# Patient Record
Sex: Male | Born: 1995 | Race: Black or African American | Hispanic: No | Marital: Single | Smoking: Current every day smoker
Health system: Southern US, Community
[De-identification: ages and names within clinical notes are randomized; demographics above are authoritative.]

---

## 2016-10-18 ENCOUNTER — Ambulatory Visit (HOSPITAL_COMMUNITY)
Admission: EM | Admit: 2016-10-18 | Discharge: 2016-10-18 | Disposition: A | Discharge status: Home / Self Care | Payer: Self-pay | Attending: Emergency Medicine | Admitting: Emergency Medicine

## 2016-10-18 ENCOUNTER — Encounter (HOSPITAL_COMMUNITY)
Admission: EM | Disposition: A | Discharge status: Home / Self Care | Payer: Self-pay | Source: Home / Self Care | Attending: Emergency Medicine

## 2016-10-18 ENCOUNTER — Encounter (HOSPITAL_COMMUNITY): Payer: Self-pay

## 2016-10-18 ENCOUNTER — Emergency Department (HOSPITAL_COMMUNITY): Payer: Self-pay

## 2016-10-18 DIAGNOSIS — R490 Dysphonia: Secondary | ICD-10-CM | POA: Insufficient documentation

## 2016-10-18 DIAGNOSIS — R112 Nausea with vomiting, unspecified: Secondary | ICD-10-CM | POA: Insufficient documentation

## 2016-10-18 DIAGNOSIS — F17210 Nicotine dependence, cigarettes, uncomplicated: Secondary | ICD-10-CM | POA: Insufficient documentation

## 2016-10-18 DIAGNOSIS — R079 Chest pain, unspecified: Secondary | ICD-10-CM | POA: Insufficient documentation

## 2016-10-18 DIAGNOSIS — T5491XA Toxic effect of unspecified corrosive substance, accidental (unintentional), initial encounter: Secondary | ICD-10-CM | POA: Insufficient documentation

## 2016-10-18 DIAGNOSIS — K219 Gastro-esophageal reflux disease without esophagitis: Secondary | ICD-10-CM | POA: Insufficient documentation

## 2016-10-18 HISTORY — PX: ESOPHAGOGASTRODUODENOSCOPY: SHX5428

## 2016-10-18 LAB — CBC WITH DIFFERENTIAL/PLATELET
BASOS PCT: 1 %
Basophils Absolute: 0 10*3/uL (ref 0.0–0.1)
Eosinophils Absolute: 0.2 10*3/uL (ref 0.0–0.7)
Eosinophils Relative: 2 %
HEMATOCRIT: 43.6 % (ref 39.0–52.0)
HEMOGLOBIN: 14.7 g/dL (ref 13.0–17.0)
LYMPHS PCT: 15 %
Lymphs Abs: 1.3 10*3/uL (ref 0.7–4.0)
MCH: 27.3 pg (ref 26.0–34.0)
MCHC: 33.7 g/dL (ref 30.0–36.0)
MCV: 81 fL (ref 78.0–100.0)
MONOS PCT: 7 %
Monocytes Absolute: 0.6 10*3/uL (ref 0.1–1.0)
NEUTROS ABS: 6.5 10*3/uL (ref 1.7–7.7)
NEUTROS PCT: 75 %
Platelets: 274 10*3/uL (ref 150–400)
RBC: 5.38 MIL/uL (ref 4.22–5.81)
RDW: 12.5 % (ref 11.5–15.5)
WBC: 8.5 10*3/uL (ref 4.0–10.5)

## 2016-10-18 LAB — BASIC METABOLIC PANEL
ANION GAP: 10 (ref 5–15)
BUN: 12 mg/dL (ref 6–20)
CALCIUM: 9.8 mg/dL (ref 8.9–10.3)
CHLORIDE: 107 mmol/L (ref 101–111)
CO2: 22 mmol/L (ref 22–32)
Creatinine, Ser: 1 mg/dL (ref 0.61–1.24)
GFR calc non Af Amer: >60 mL/min (ref >60)
Glucose, Bld: 105 mg/dL — ABNORMAL HIGH (ref 65–99)
POTASSIUM: 3.8 mmol/L (ref 3.5–5.1)
Sodium: 139 mmol/L (ref 135–145)

## 2016-10-18 SURGERY — EGD (ESOPHAGOGASTRODUODENOSCOPY)
Anesthesia: Moderate Sedation

## 2016-10-18 MED ORDER — SODIUM CHLORIDE 0.9 % IV SOLN: INTRAVENOUS | Status: DC

## 2016-10-18 MED ORDER — ONDANSETRON 4 MG PO TBDP: 4.0000 mg | ORAL_TABLET | Freq: Three times a day (TID) | ORAL | 0 refills | Status: AC | PRN

## 2016-10-18 MED ORDER — DIPHENHYDRAMINE HCL 50 MG/ML IJ SOLN
INTRAMUSCULAR | Status: DC | PRN
  Administered 2016-10-18: 25 mg via INTRAVENOUS

## 2016-10-18 MED ORDER — SODIUM CHLORIDE 0.9 % IV BOLUS (SEPSIS)
1000.0000 mL | Freq: Once | INTRAVENOUS | Status: AC
  Administered 2016-10-18: 1000 mL via INTRAVENOUS

## 2016-10-18 MED ORDER — FENTANYL CITRATE (PF) 100 MCG/2ML IJ SOLN
INTRAMUSCULAR | Status: DC | PRN
  Administered 2016-10-18 (×2): 25 ug via INTRAVENOUS

## 2016-10-18 MED ORDER — MIDAZOLAM HCL 5 MG/ML IJ SOLN
INTRAMUSCULAR | Status: AC
  Filled 2016-10-18: qty 2

## 2016-10-18 MED ORDER — ONDANSETRON HCL 4 MG/2ML IJ SOLN
4.0000 mg | Freq: Once | INTRAMUSCULAR | Status: AC
  Administered 2016-10-18: 4 mg via INTRAVENOUS
  Filled 2016-10-18: qty 2

## 2016-10-18 MED ORDER — FENTANYL CITRATE (PF) 100 MCG/2ML IJ SOLN
INTRAMUSCULAR | Status: AC
  Filled 2016-10-18: qty 2

## 2016-10-18 MED ORDER — DIPHENHYDRAMINE HCL 50 MG/ML IJ SOLN
INTRAMUSCULAR | Status: AC
  Filled 2016-10-18: qty 1

## 2016-10-18 MED ORDER — MIDAZOLAM HCL 10 MG/2ML IJ SOLN
INTRAMUSCULAR | Status: DC | PRN
  Administered 2016-10-18 (×2): 2 mg via INTRAVENOUS

## 2016-10-18 NOTE — ED Triage Notes (Signed)
Pt reports he drank carpet cleaner that was in a mountain dew bottle. Pt initially thought it was detergent and called poison control. Pt now states he is vomiting and vomiting blood. Pt does not know the brand or type of carpet cleaner.

## 2016-10-18 NOTE — ED Notes (Signed)
Pt states that he is sick and cannot stop throwing up. Pt in waiting room drinking water and eating rice crispy treats with no problem.

## 2016-10-18 NOTE — Sedation Documentation (Signed)
Pt able to ambulate without difficulty to bathroom.

## 2016-10-18 NOTE — ED Notes (Signed)
GI team at bedside.  Consent signed.

## 2016-10-18 NOTE — Discharge Instructions (Signed)
Stay on a liquid diet today. Tomorrow, if you feel able, try soft foods and advance diet as you feel able.  Zofran as needed for nausea. The coupon I have provided is only valid at [facility].  Return to ER for new or worsening symptoms, any additional concerns.

## 2016-10-18 NOTE — ED Notes (Signed)
Pt continues to spit into emesis bag.  States he is nauseated.

## 2016-10-18 NOTE — ED Notes (Signed)
Pt pacing around waiting room stating that he feels like he is going to pass out. Called to see if room was ready. Staff in back stated they were coming to get the pt at this time.

## 2016-10-18 NOTE — Op Note (Signed)
[facility] Patient Name: Gabriel Frank Procedure Date : 10/18/2016 MRN: [id number] Attending MD: Kathi Der , MD Date of Birth: Apr 04, 1996 CSN: [id number] Age: 20 Admit Type: Emergency Department Procedure:                Upper GI endoscopy Indications:              Suspected esophageal injury due to caustic ingestion Providers:                Kathi Der, MD, Omelia Blackwater, RN, Clearnce Sorrel, Technician Referring MD:              Medicines:                Midazolam 4 mg IV, Fentanyl 50 micrograms IV,                            Diphenhydramine 25 mg IV Complications:            No immediate complications. Estimated Blood Loss:     Estimated blood loss: none. Procedure:                Pre-Anesthesia Assessment:                           - Prior to the procedure, a History and Physical                            was performed, and patient medications and                            allergies were reviewed. The patient's tolerance of                            previous anesthesia was also reviewed. The risks                            and benefits of the procedure and the sedation                            options and risks were discussed with the patient.                            All questions were answered, and informed consent                            was obtained. Prior Anticoagulants: The patient has                            taken no previous anticoagulant or antiplatelet                            agents. ASA Grade Assessment: I - A normal, healthy  patient. After reviewing the risks and benefits,                            the patient was deemed in satisfactory condition to                            undergo the procedure.                           After obtaining informed consent, the endoscope was                            passed under direct vision. Throughout the   procedure, the patient's blood pressure, pulse, and                            oxygen saturations were monitored continuously. The                            EG-[id number] ([id number]) scope was introduced through the                            mouth, with the intention of advancing to the                            stomach. The scope was advanced to the prepyloric                            antrum before the procedure was aborted.                            Medications were given. The procedure was aborted                            due to the patient's combativeness. Scope In: Scope Out: Findings:      The examined esophagus was normal.      Retained fluid was found in the gastric fundus and in the gastric body.      The exam was otherwise without abnormality. Procedure was aborted as       patient was combative and he remvoed scope out by his hands . Impression:               - The procedure was aborted due to the patient's                            combativeness.                           - The procedure was aborted.                           - Normal esophagus.                           - Retained gastric fluid.                           -  The examination was otherwise normal.                           - No specimens collected. Moderate Sedation:      Moderate (conscious) sedation was administered by the endoscopy nurse       and supervised by the endoscopist. The following parameters were       monitored: oxygen saturation, heart rate, blood pressure, and response       to care. Recommendation:           - Patient has a contact number available for                            emergencies. The signs and symptoms of potential                            delayed complications were discussed with the                            patient. Return to normal activities tomorrow.                            Written discharge instructions were provided to the                            patient.                            - Full liquid diet.                           - Continue present medications. Procedure Code(s):        --- Professional ---                           [phone], 52, Esophagogastroduodenoscopy, flexible,                            transoral; diagnostic, including collection of                            specimen(s) by brushing or washing, when performed                            (separate procedure) Diagnosis Code(s):        --- Professional ---                           Z53.8, Procedure and treatment not carried out for                            other reasons                           T54.94XA, Toxic effect of unspecified corrosive  substance, undetermined, initial encounter CPT copyright 2016 [employer] Association. All rights reserved. The codes documented in this report are preliminary and upon coder review may  be revised to meet current compliance requirements. [name], MD Kathi DerParag Gabriel Eddleman, MD 10/18/2016 1:49:36 PM Number of Addenda: 0

## 2016-10-18 NOTE — Brief Op Note (Signed)
10/18/2016  1:50 PM  PATIENT:  Gabriel ReasonKeon Frank  20 y.o. male  PRE-OPERATIVE DIAGNOSIS:  Chemichal Ingstion  POST-OPERATIVE DIAGNOSIS:  Procedure aborted, normal EGD, Not able to access duodenum.  PROCEDURE:  Procedure(s): ESOPHAGOGASTRODUODENOSCOPY (EGD) (N/A)  SURGEON:  Surgeon(s) and Role:    * Kathi DerParag Ulanda Tackett, MD - Primary  Recommendations ------------------------- - Esophageal mucosa appeared normal. Retained liquid in the stomach but otherwise normal-appearing gastric mucosa. - Patient was combative and I was not able to advance scope into the duodenum. Procedure was aborted. - Okay to discharge home from GI standpoint if remains asymptomatic. - Liquid diet for now. Advance as tolerated - Recommend admission for observation if continues to have abdominal pain or chest pain or any other GI symptoms.  Kathi DerParag Salima Rumer , MD Jerrel IvoryFACP

## 2016-10-18 NOTE — Consult Note (Signed)
Referring Provider: Dr. Elesa MassedWard Primary Care Physician:  No primary care provider on file. Primary Gastroenterologist:  Gentry FitzUnassigned  Reason for Consultation:  Ingestion of carpet cleaner  HPI: Gabriel Frank is a 20 y.o. male brought into the ER by family member after accidentally drinking carpet cleaner. According to family member they had [facility] carpet [facility]. Patient woke up this morning around 5 AM and drink that bottle assuming that its [facility]. He only drinks small amount. Immediately after that patient started having nausea and vomiting and epigastric discomfort. Noted small amount of streaks of bright blood in the vomiting. Complaining of acid reflux. Denied NSAID use. No prior endoscopy.   History reviewed. No pertinent past medical history.  History reviewed. No pertinent surgical history.  Prior to Admission medications   Not on File    Scheduled Meds: Continuous Infusions: PRN Meds:.  Allergies as of 10/18/2016  . (No Known Allergies)    History reviewed. No pertinent family history.  Social History   Social History  . Marital status: Single    Spouse name: N/A  . Number of children: N/A  . Years of education: N/A   Occupational History  . Not on file.   Social History Main Topics  . Smoking status: Current Every Day Smoker    Packs/day: 0.50    Types: Cigarettes  . Smokeless tobacco: Never Used  . Alcohol use No  . Drug use: Unknown  . Sexual activity: Not on file   Other Topics Concern  . Not on file   Social History Narrative  . No narrative on file    Review of Systems: All negative except as stated above in HPI.  Physical Exam: Vital signs: Vitals:   10/18/16 1200 10/18/16 1215  BP: 156/89 154/81  Pulse: 63 79  Resp: 21 18  Temp:       General:   Alert,  Well-developed, well-nourished, pleasant and cooperative in NAD HEENT: NS, AT  Oral mucosa. Moist. No lesion. Lungs:  Clear throughout to auscultation.   No  wheezes, crackles, or rhonchi. No acute distress. Heart:  Regular rate and rhythm; no murmurs, clicks, rubs,  or gallops. Abdomen: Soft. Mild epigastric discomfort. No peritoneal signs. Bowel sounds present. Extremity-no edema   GI:  Lab Results:  Recent Labs  10/18/16 0949  WBC 8.5  HGB 14.7  HCT 43.6  PLT 274   BMET  Recent Labs  10/18/16 0949  NA 139  K 3.8  CL 107  CO2 22  GLUCOSE 105*  BUN 12  CREATININE 1.00  CALCIUM 9.8   LFT No results for input(s): PROT, ALBUMIN, AST, ALT, ALKPHOS, BILITOT, BILIDIR, IBILI in the last 72 hours. PT/INR No results for input(s): LABPROT, INR in the last 72 hours.   Studies/Results: Dg Neck Soft Tissue  Result Date: 10/18/2016 CLINICAL DATA:  Caustic ingestion. Throat burning and shortness of breath. Vomiting. EXAM: NECK SOFT TISSUES - 1+ VIEW COMPARISON:  None. FINDINGS: The prevertebral soft tissues are normal. The epiglottis appears normal. No evidence of airway foreign body or compromise. The bones appear normal. IMPRESSION: Negative examination. No evidence soft tissue swelling or airway compromise. Electronically Signed   By: Carey BullocksWilliam  Veazey M.D.   On: 10/18/2016 11:05   Dg Chest 2 View  Result Date: 10/18/2016 CLINICAL DATA:  Caustic ingestion. Throat burning and shortness of breath. Vomiting. EXAM: CHEST  2 VIEW COMPARISON:  None. FINDINGS: The heart size and mediastinal contours are normal. The lungs are clear.  There is no pleural effusion or pneumothorax. No acute osseous findings are identified. IMPRESSION: No active cardiopulmonary process. Electronically Signed   By: Carey BullocksWilliam  Veazey M.D.   On: 10/18/2016 11:04    Impression/Plan: - Accidental ingestion of carpet cleaner/caustic substance. - Epigastric pain - Acid reflux  Recommendations -------------------------- - EGD today. Risk and benefits and alternatives discussed with the patient and family. Verbalized understanding. - Further plan based on endoscopic  finding.    LOS: 0 days   Kathi DerParag Verania Salberg  MD, FACP 10/18/2016, 12:31 PM  Pager [phone] If no answer or after 5 PM call [phone]

## 2016-10-18 NOTE — ED Provider Notes (Signed)
[facility]-EMERGENCY DEPT Provider Note   CSN: [id number] Arrival date & time: 10/18/16  0818     History   Chief Complaint Chief Complaint  Patient presents with  . Ingestion    HPI Ramzy Cappelletti is a 20 y.o. male.  The history is provided by the patient and medical records. No language interpreter was used.     Saxon Barich is an otherwise healthy 20 y.o. male  who presents to the Emergency Department for evaluation after accidental ingestion at 5-6 am this morning (approx. 4 hours prior to evaluation). Patient states he grabbed a mountain dew bottle from the top of the refrigerator and took a big gulp. He immediately sip it out and threw up. Unsure how much he swallowed if any. Unfortunately, his [place] Dew bottle was full of an unknown carpet cleaner. Family at bedside state that he carpet cleaner and the bottle was supposed to be diluted with water before use. They're unsure of the brand as cleaner had been in soda bottle for several months. Patient states since ingestion, he has been experiencing palpitations, sore throat, hoarse voice, stomach ache and chest pain. He denies any shortness of breath. He ate a rice krispie treat while in the waiting room and states he tolerated this fine with no pain with swallowing or emesis.    History reviewed. No pertinent past medical history.  There are no active problems to display for this patient.   History reviewed. No pertinent surgical history.     Home Medications    Prior to Admission medications   Medication Sig Start Date End Date Taking? Authorizing Provider  ondansetron (ZOFRAN ODT) 4 MG disintegrating tablet Take 1 tablet (4 mg total) by mouth every 8 (eight) hours as needed for nausea or vomiting. 10/18/16   Chase Picket Terry Abila, PA-C    Family History History reviewed. No pertinent family history.  Social History Social History  Substance Use Topics  . Smoking status: Current Every Day Smoker    Packs/day: 0.50   Types: Cigarettes  . Smokeless tobacco: Never Used  . Alcohol use No     Allergies   Patient has no known allergies.   Review of Systems Review of Systems  Constitutional: Negative for chills and fever.  HENT: Positive for sore throat. Negative for congestion.   Eyes: Negative for redness.  Respiratory: Negative for cough and shortness of breath.   Cardiovascular: Positive for chest pain. Negative for palpitations and leg swelling.  Gastrointestinal: Positive for abdominal pain, nausea and vomiting.  Genitourinary: Negative for dysuria.  Skin: Negative for rash.  Allergic/Immunologic: Negative for immunocompromised state.  Neurological: Negative for headaches.     Physical Exam Updated Vital Signs BP 138/77   Pulse 95   Temp 98.3 F (36.8 C) (Oral)   Resp 18   SpO2 100%   Physical Exam  Constitutional: He is oriented to person, place, and time. He appears well-developed and well-nourished. No distress.  HENT:  Head: Normocephalic and atraumatic.  Hoarse voice. Airway patent. No swelling.   Neck:  Full ROM. No tenderness or swelling.   Cardiovascular: Normal rate, regular rhythm and normal heart sounds.   No murmur heard. Pulmonary/Chest: Effort normal and breath sounds normal. No respiratory distress. He has no wheezes. He has no rales. He exhibits no tenderness.  Abdominal: Soft. He exhibits no distension. There is no tenderness.  Musculoskeletal: Normal range of motion.  Neurological: He is alert and oriented to person, place, and time.  Skin: Skin is  warm and dry.  Nursing note and vitals reviewed.    ED Treatments / Results  Labs (all labs ordered are listed, but only abnormal results are displayed) Labs Reviewed  BASIC METABOLIC PANEL - Abnormal; Notable for the following:       Result Value   Glucose, Bld 105 (*)    All other components within normal limits  CBC WITH DIFFERENTIAL/PLATELET    EKG  EKG Interpretation  Date/Time:  Friday October 18 2016 10:01:41 EST Ventricular Rate:  74 PR Interval:    QRS Duration: 82 QT Interval:  365 QTC Calculation: 405 R Axis:   79 Text Interpretation:  Sinus arrhythmia RSR' in V1 or V2, probably normal variant Abnrm T, consider ischemia, anterolateral lds ST elev, probable normal early repol pattern Confirmed by [facility] MontgomeryAVILAND MD, JULIE ([id number]) on 10/18/2016 10:21:39 AM Also confirmed by [facility], JULIE ([id number]), editor [place], Cala BradfordKIMBERLY [phone]([id number])  on 10/18/2016 12:21:15 PM       Radiology Dg Neck Soft Tissue  Result Date: 10/18/2016 CLINICAL DATA:  Caustic ingestion. Throat burning and shortness of breath. Vomiting. EXAM: NECK SOFT TISSUES - 1+ VIEW COMPARISON:  None. FINDINGS: The prevertebral soft tissues are normal. The epiglottis appears normal. No evidence of airway foreign body or compromise. The bones appear normal. IMPRESSION: Negative examination. No evidence soft tissue swelling or airway compromise. Electronically Signed   By: Carey BullocksWilliam  Veazey M.D.   On: 10/18/2016 11:05   Dg Chest 2 View  Result Date: 10/18/2016 CLINICAL DATA:  Caustic ingestion. Throat burning and shortness of breath. Vomiting. EXAM: CHEST  2 VIEW COMPARISON:  None. FINDINGS: The heart size and mediastinal contours are normal. The lungs are clear. There is no pleural effusion or pneumothorax. No acute osseous findings are identified. IMPRESSION: No active cardiopulmonary process. Electronically Signed   By: Carey BullocksWilliam  Veazey M.D.   On: 10/18/2016 11:04    Procedures Procedures (including critical care time)  Medications Ordered in ED Medications  sodium chloride 0.9 % bolus 1,000 mL (0 mLs Intravenous Stopped 10/18/16 1258)  ondansetron (ZOFRAN) injection 4 mg (4 mg Intravenous Given 10/18/16 1301)     Initial Impression / Assessment and Plan / ED Course  I have reviewed the triage vital signs and the nursing notes.  Pertinent labs & imaging results that were available during my care of the patient were  reviewed by me and considered in my medical decision making (see chart for details).  Clinical Course    Roylene ReasonKeon Harcum is a 20 y.o. male who presents to ED for accidental ingestion of unknown carpet cleaner around 5-6 am this morning. On exam, Patient is hemodynamically stable with a patent airway and clear lungs bilaterally. 98-100% O2 on room air. He does have a hoarse voice which family at bedside state is atypical for him. Will obtain basic labs, chest and neck soft tissue x-rays and discuss with poison control.   10:04 AM - Poison control consulted who states that several carpet cleaners are Caustic in nature, therefore to treat this as caustic no exact brand of carpet cleaner is unknown. We'll keep patient nothing by mouth and consult GI for possible endoscopy. Poison control also encourages cardiac monitoring, CMP and EKG for further evaluation.   10:18 AM - Discussed case with GI who will come evaluate patient. Likely will need endoscopy.   11:19 AM - Patient re-evaluated. Resting comfortably with no complaints. Labs and imaging reviewed and reassuring. Awaiting GI consultation.   GI performed bedside endoscopy, please see consultation  note for further details. Per Dr. Levora AngelBrahmbhatt, there did not appear to be any damage from the ingestion. Patient is coming out of sedation, but if he awakes and is asymptomatic, he is safe to be discharged home from a GI standpoint. He also recommends admission to patient still complaining of chest pain.  Patient reevaluated and has no complaints at this time. He is able to tolerate PO and ambulating in ED without difficulty. Reasons to return to the ER were discussed with patient and family at bedside. Also discussed liquid diet and when/how to advance diet. All questions answered.   Patient discussed with Dr. Particia NearingHaviland who agrees with treatment plan.   Final Clinical Impressions(s) / ED Diagnoses   Final diagnoses:  Ingestion of corrosive chemical    New  Prescriptions New Prescriptions   ONDANSETRON (ZOFRAN ODT) 4 MG DISINTEGRATING TABLET    Take 1 tablet (4 mg total) by mouth every 8 (eight) hours as needed for nausea or vomiting.     [facility] LPJaime Pilcher Aarini Slee, PA-C 10/18/16 1525    Jacalyn LefevreJulie Haviland, MD 10/18/16 [phone]

## 2016-10-21 ENCOUNTER — Encounter (HOSPITAL_COMMUNITY): Payer: Self-pay | Admitting: Gastroenterology

## 2018-07-09 IMAGING — CR DG NECK SOFT TISSUE
2 series · 2 of 2 positions shown · non-contrast
Comparison: None.

CLINICAL DATA: Caustic ingestion. Throat burning and shortness of
breath. Vomiting.

EXAM:
NECK SOFT TISSUES - 1+ VIEW

[neck lat]
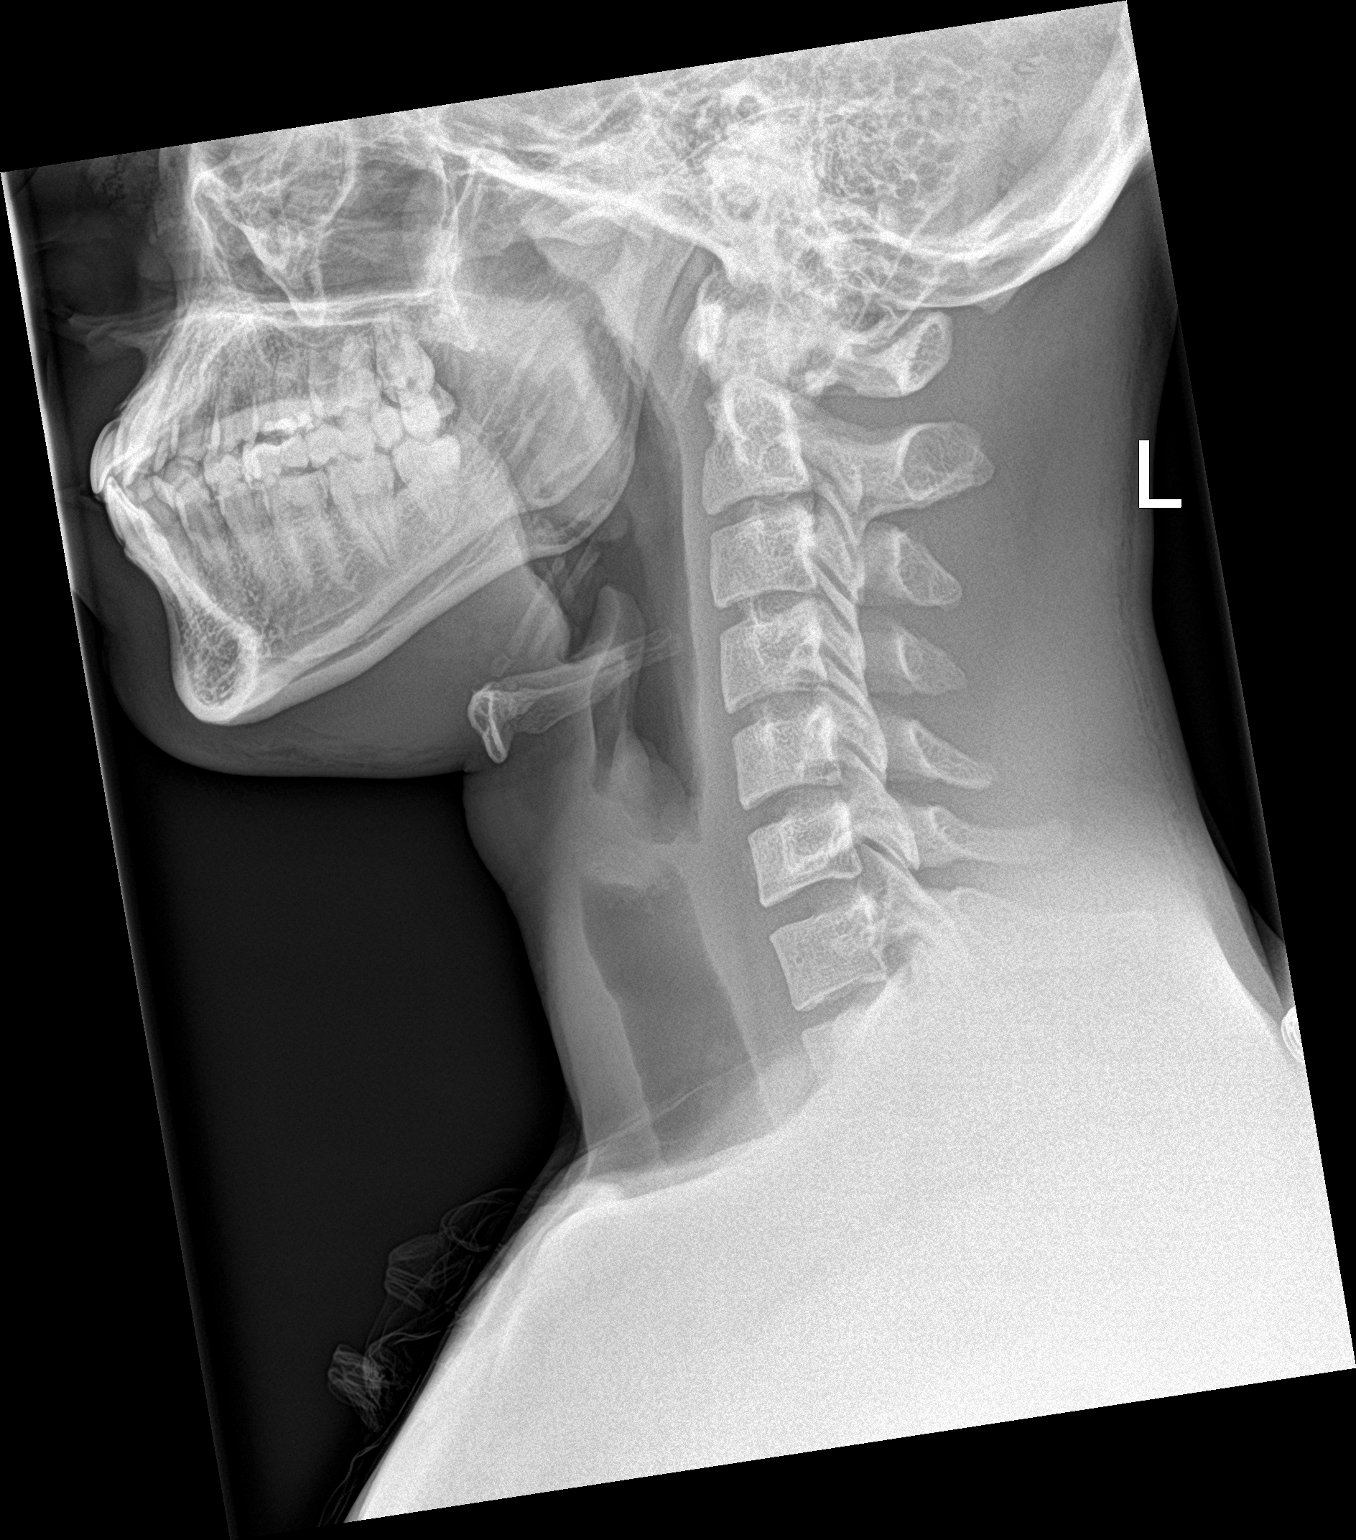

[neck ap]
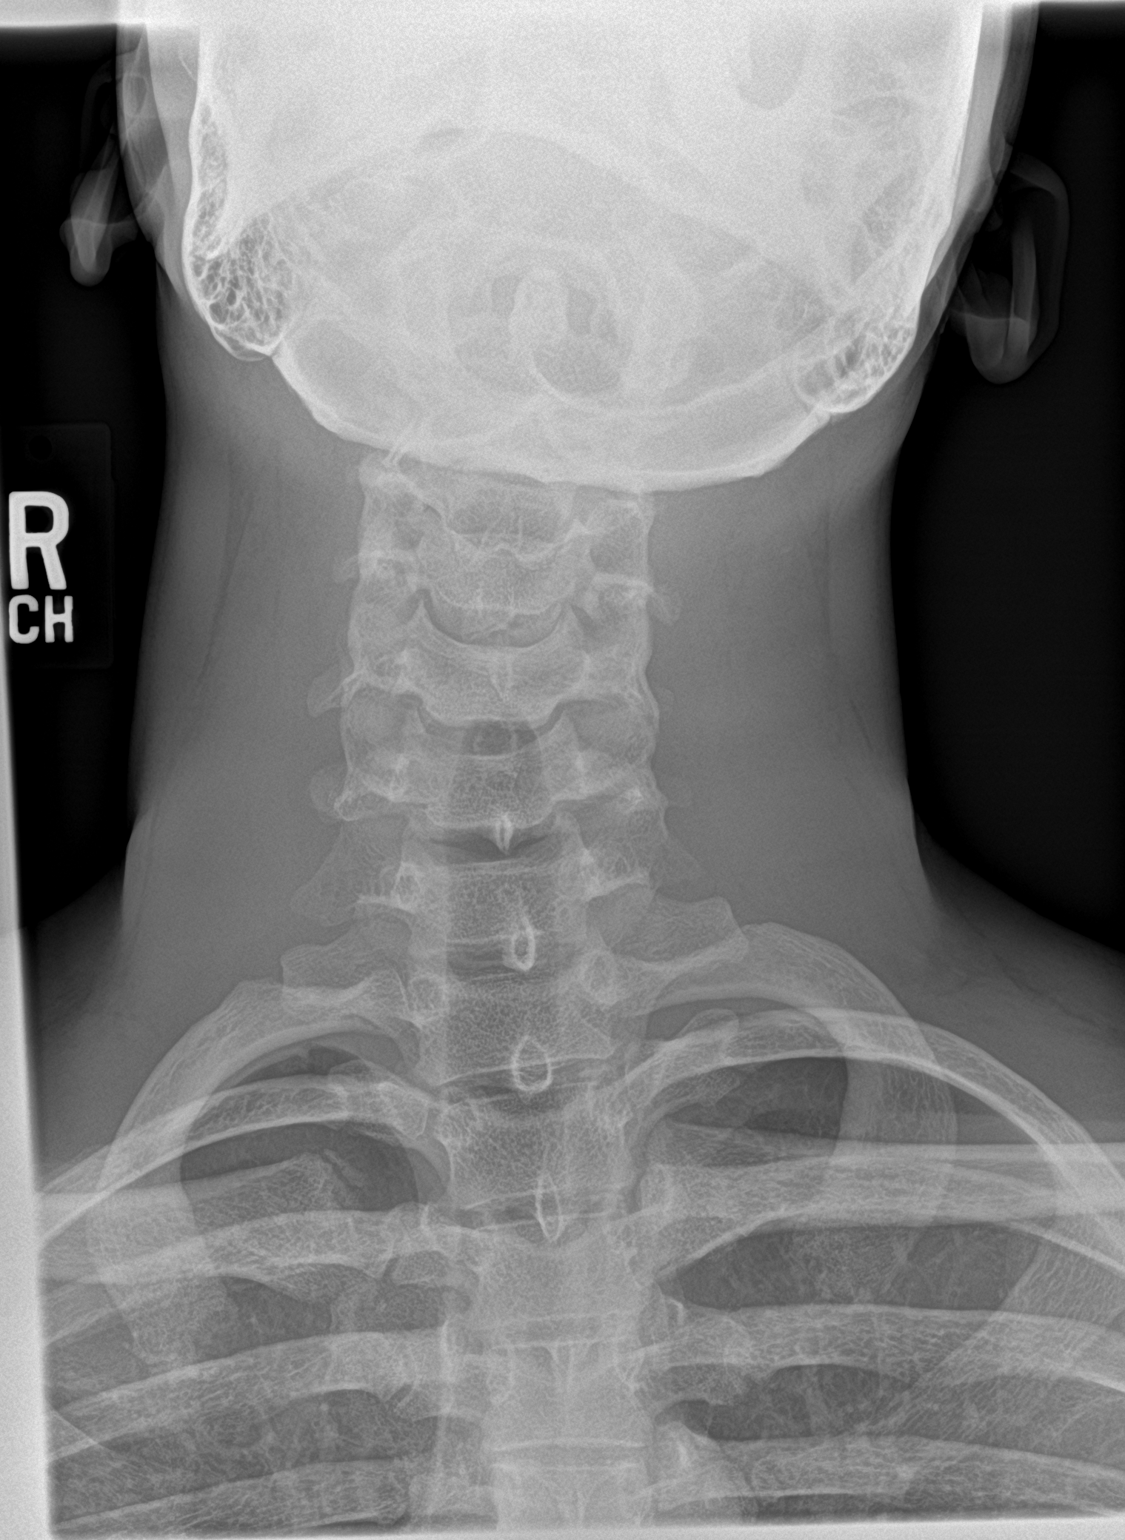

[2 of 2 positions shown; findings below may reference images not displayed]

FINDINGS: The prevertebral soft tissues are normal. The epiglottis appears
normal. No evidence of airway foreign body or compromise. The bones
appear normal.
IMPRESSION: Negative examination. No evidence soft tissue swelling or airway
compromise.

## 2018-07-09 IMAGING — CR DG CHEST 2V
2 series · 2 of 2 positions shown · non-contrast
Comparison: None.

CLINICAL DATA: Caustic ingestion. Throat burning and shortness of
breath. Vomiting.

EXAM:
CHEST  2 VIEW

[chest pa]
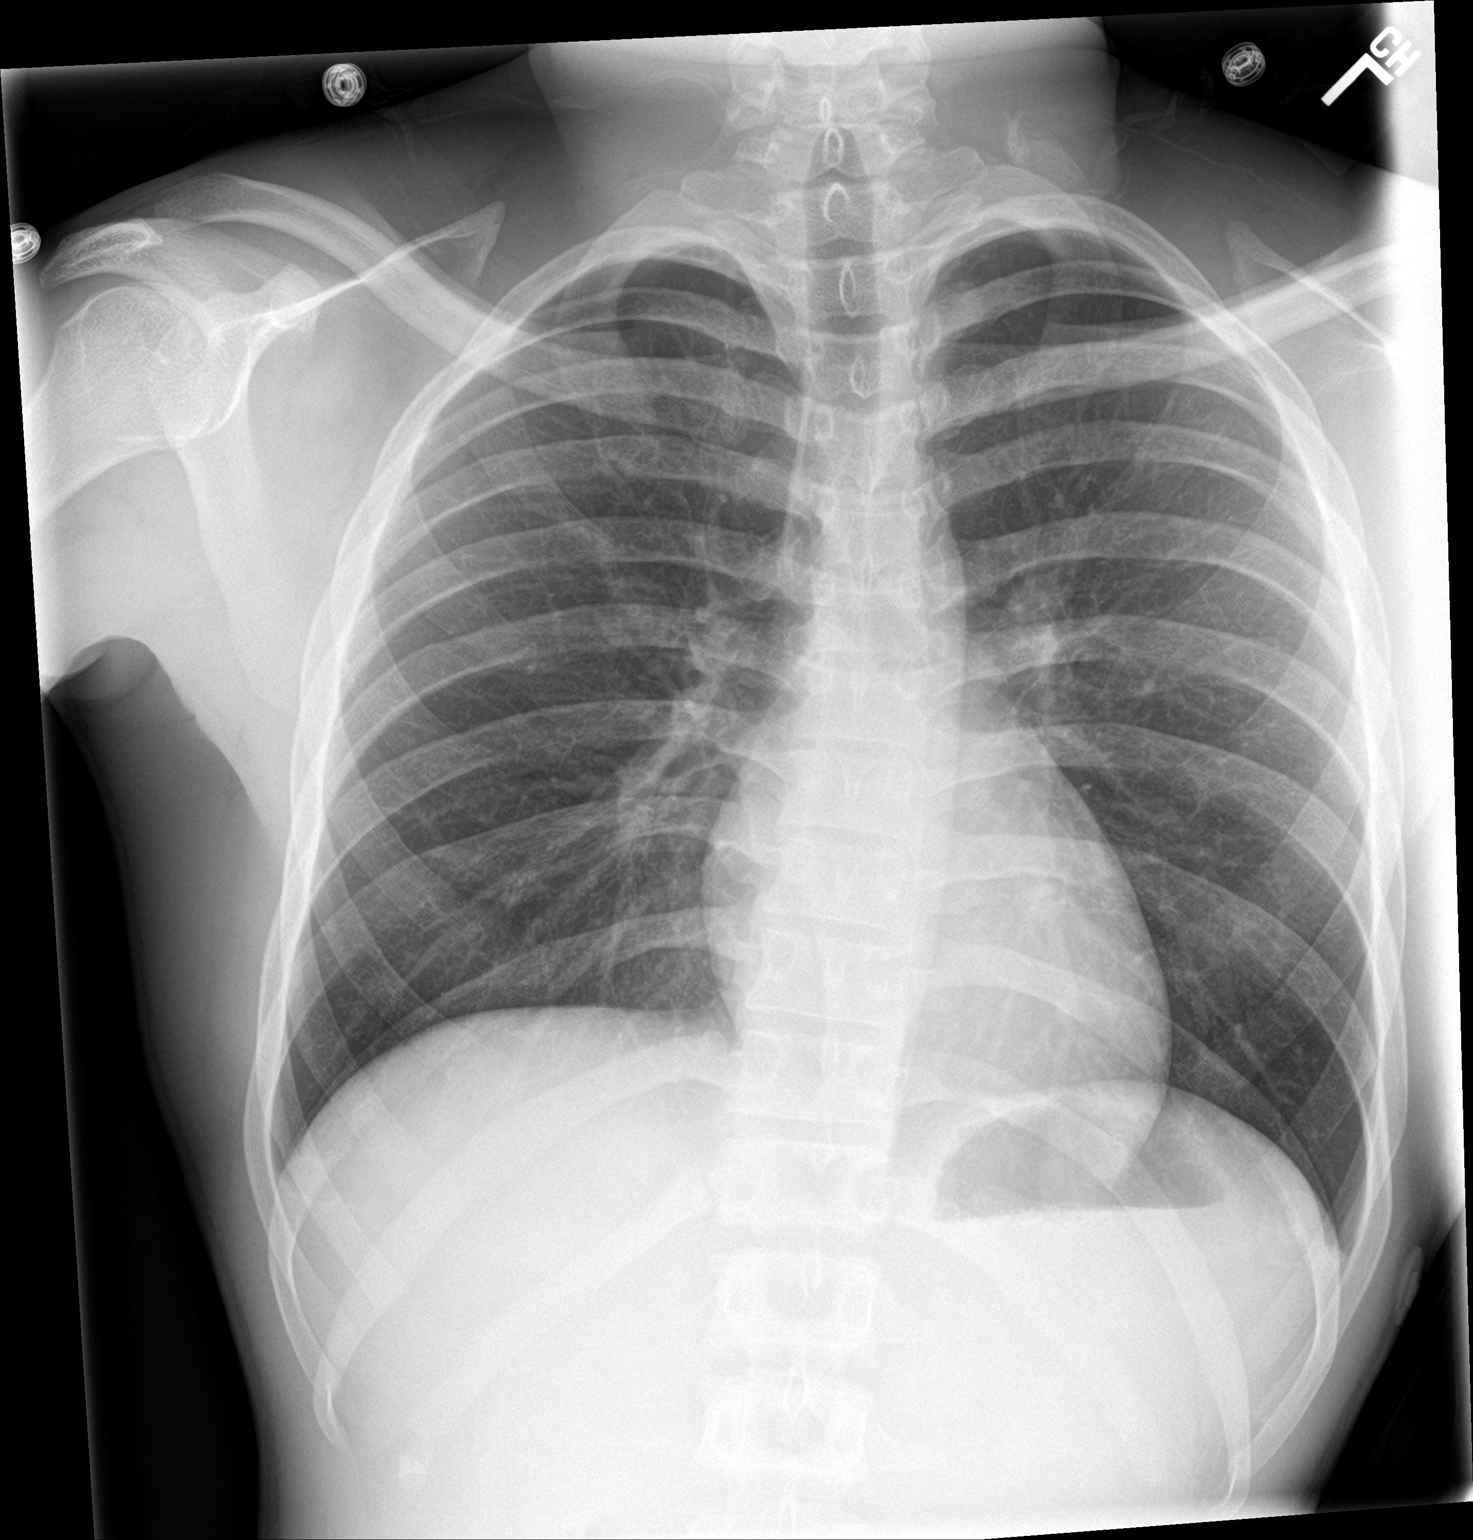

[chest lat]
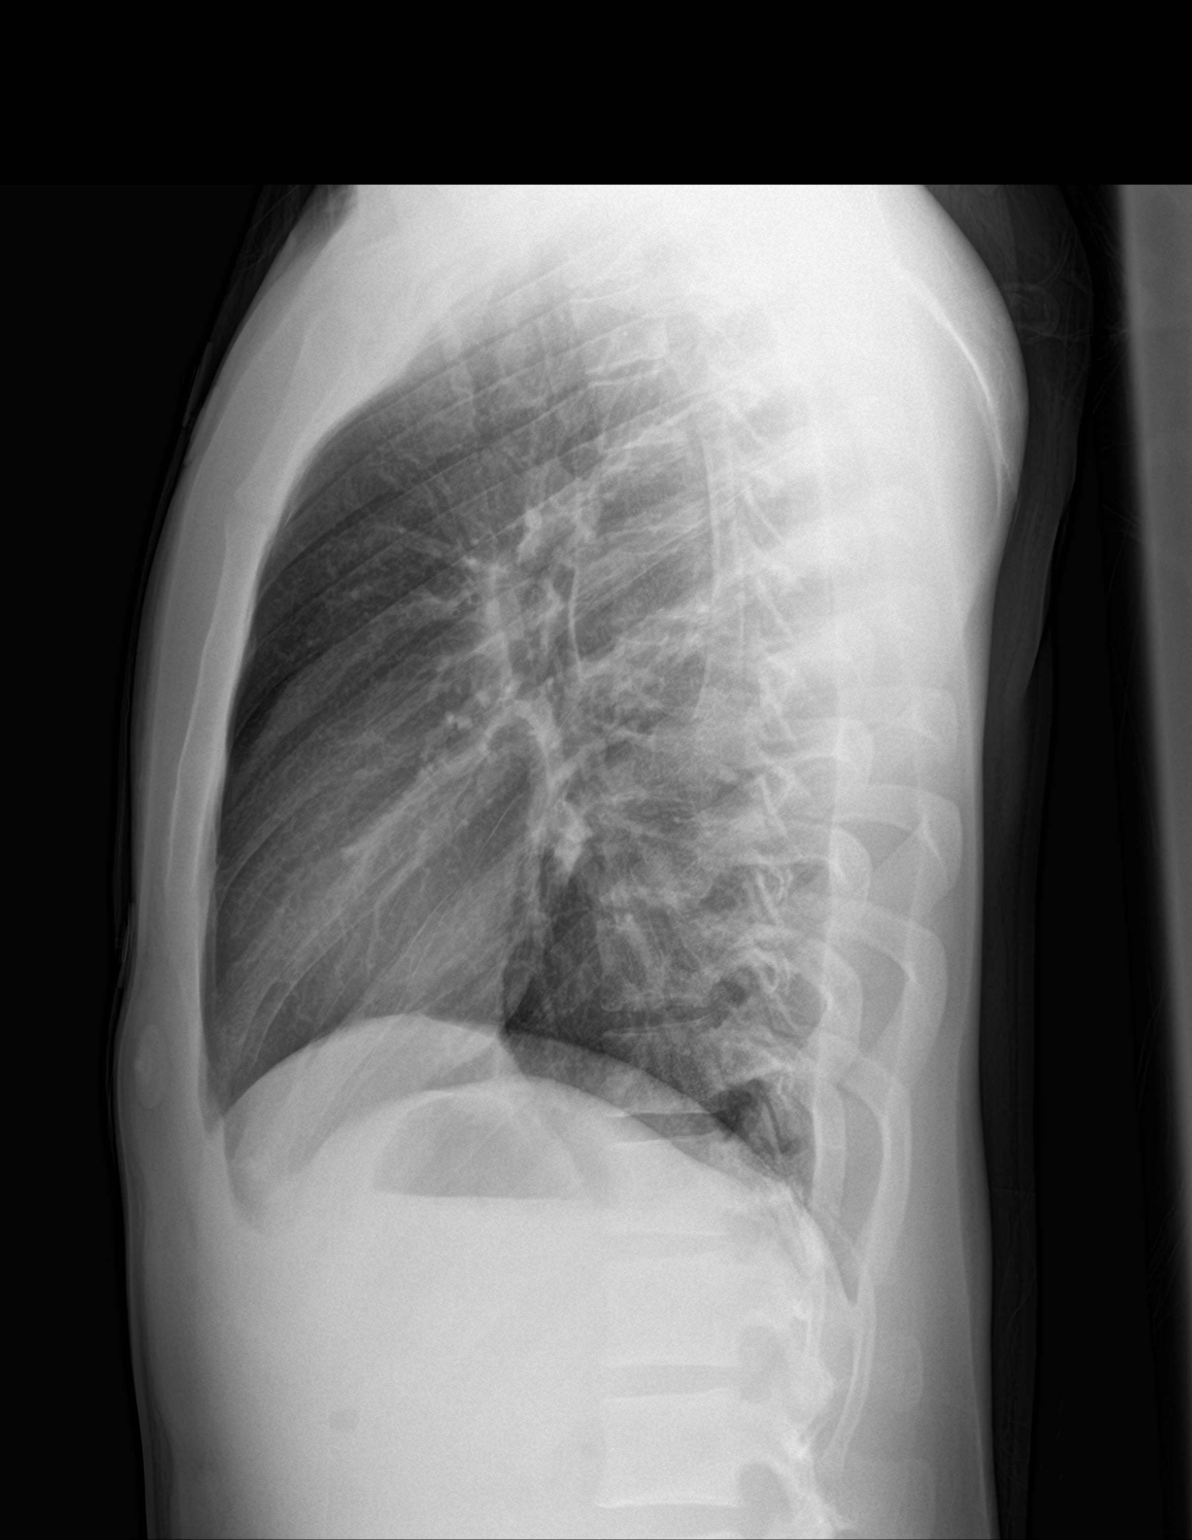

[2 of 2 positions shown; findings below may reference images not displayed]

FINDINGS: The heart size and mediastinal contours are normal. The lungs are
clear. There is no pleural effusion or pneumothorax. No acute
osseous findings are identified.
IMPRESSION: No active cardiopulmonary process.
